# Patient Record
Sex: Female | Born: 1995 | Race: Black or African American | Hispanic: No | Marital: Single | Smoking: Current every day smoker
Health system: Southern US, Community
[De-identification: ages and names within clinical notes are randomized; demographics above are authoritative.]

---

## 2020-01-21 ENCOUNTER — Ambulatory Visit
Admission: EM | Admit: 2020-01-21 | Discharge: 2020-01-21 | Disposition: A | Discharge status: Home / Self Care | Payer: No Typology Code available for payment source

## 2020-01-21 ENCOUNTER — Other Ambulatory Visit: Payer: Self-pay

## 2020-01-21 ENCOUNTER — Ambulatory Visit (INDEPENDENT_AMBULATORY_CARE_PROVIDER_SITE_OTHER): Payer: No Typology Code available for payment source

## 2020-01-21 DIAGNOSIS — S93602A Unspecified sprain of left foot, initial encounter: Secondary | ICD-10-CM | POA: Diagnosis not present

## 2020-01-21 DIAGNOSIS — S93402A Sprain of unspecified ligament of left ankle, initial encounter: Secondary | ICD-10-CM

## 2020-01-21 DIAGNOSIS — M79672 Pain in left foot: Secondary | ICD-10-CM | POA: Diagnosis not present

## 2020-01-21 MED ORDER — IBUPROFEN 800 MG PO TABS
800.0000 mg | ORAL_TABLET | Freq: Three times a day (TID) | ORAL | 0 refills | Status: DC
Start: 2020-01-21 — End: 2021-05-18

## 2020-01-21 NOTE — ED Provider Notes (Signed)
[facility]    CSN: [id number] Arrival date & time: 01/21/20  1146      History   Chief Complaint Chief Complaint  Patient presents with  . Ankle Pain    HPI Cheryl Townsend is a 24 y.o. female.   History of Present Illness  Cheryl Townsend is a 24 y.o. female that complains of pain in the anterior aspect of the left foot and ankle without radiation after a twisting injury and fall. Onset of symptoms was 1 day ago. Patient describes pain as aching and throbbing. Pain severity now is moderate. Pain is aggravated by movement, weight bearing and palpation. Pain is alleviated by rest, acetaminophen, elevation and ACE wrap. Patient denies any numbness, tingling, weakness, loss of sensation or loss of motion in the extremity. The patient reports that she is unable to bear weight to the LLE due to the pain. The patient denies other injuries.  Care prior to arrival consisted of rest, acetaminophen, elevation, ice and ACE wrap with minimal relief.         History reviewed. No pertinent past medical history.  There are no problems to display for this patient.   History reviewed. No pertinent surgical history.  OB History   No obstetric history on file.      Home Medications    Prior to Admission medications   Medication Sig Start Date End Date Taking? Authorizing Provider  acetaminophen (TYLENOL) 500 MG tablet Take 500 mg by mouth every 6 (six) hours as needed.   Yes [provider]  ibuprofen (ADVIL) 800 MG tablet Take 1 tablet (800 mg total) by mouth 3 (three) times daily. 01/21/20   Lurline Idol, FNP    Family History History reviewed. No pertinent family history.  Social History Social History   Tobacco Use  . Smoking status: Current Every Day Smoker    Types: Cigarettes  . Smokeless tobacco: Never Used  Substance Use Topics  . Alcohol use: Yes  . Drug use: Never     Allergies   Patient has no known allergies.   Review of  Systems Review of Systems  Musculoskeletal: Positive for arthralgias.  All other systems reviewed and are negative.    Physical Exam Triage Vital Signs ED Triage Vitals  Enc Vitals Group     BP 01/21/20 1207 (!) 126/86     Pulse Rate 01/21/20 1207 94     Resp 01/21/20 1207 16     Temp 01/21/20 1207 98.3 F (36.8 C)     Temp Source 01/21/20 1207 Oral     SpO2 01/21/20 1207 98 %     Weight --      Height --      Head Circumference --      Peak Flow --      Pain Score 01/21/20 1205 5     Pain Loc --      Pain Edu? --      Excl. in GC? --    No data found.  Updated Vital Signs BP (!) 126/86 (BP Location: Right Arm)   Pulse 94   Temp 98.3 F (36.8 C) (Oral)   Resp 16   LMP  (Within Weeks) Comment: 2 weeks  SpO2 98%   Visual Acuity Right Eye Distance:   Left Eye Distance:   Bilateral Distance:    Right Eye Near:   Left Eye Near:    Bilateral Near:     Physical Exam Vitals reviewed.  Constitutional:  Appearance: Normal appearance.  HENT:     Head: Normocephalic.  Cardiovascular:     Rate and Rhythm: Normal rate and regular rhythm.  Pulmonary:     Effort: Pulmonary effort is normal.  Musculoskeletal:        General: Normal range of motion.     Cervical back: Normal range of motion and neck supple.     Left ankle: No swelling or deformity. Tenderness present. Normal range of motion.     Left Achilles Tendon: Normal.     Left foot: Normal range of motion. Tenderness present. No swelling or deformity.  Skin:    General: Skin is warm and dry.  Neurological:     General: No focal deficit present.     Mental Status: She is alert and oriented to person, place, and time.  Psychiatric:        Mood and Affect: Mood normal.      UC Treatments / Results  Labs (all labs ordered are listed, but only abnormal results are displayed) Labs Reviewed - No data to display  EKG   Radiology DG Ankle Complete Left  Result Date: 01/21/2020 CLINICAL DATA:   Twisted ankle EXAM: LEFT ANKLE COMPLETE - 3+ VIEW COMPARISON:  None. FINDINGS: Lateral soft tissue swelling.  No fracture or dislocation. IMPRESSION: Lateral soft tissue swelling.  No fracture or dislocation. Electronically Signed   By: Paulina Fusi M.D.   On: 01/21/2020 13:00   DG Foot Complete Left  Result Date: 01/21/2020 CLINICAL DATA:  Twisted ankle.  Lateral pain. EXAM: LEFT FOOT - COMPLETE 3+ VIEW COMPARISON:  None. FINDINGS: Lateral soft tissue swelling.  No fracture or dislocation. IMPRESSION: Lateral soft tissue swelling.  No fracture or dislocation. Electronically Signed   By: Paulina Fusi M.D.   On: 01/21/2020 13:01    Procedures Procedures (including critical care time)  Medications Ordered in UC Medications - No data to display  Initial Impression / Assessment and Plan / UC Course  I have reviewed the triage vital signs and the nursing notes.  Pertinent labs & imaging results that were available during my care of the patient were reviewed by me and considered in my medical decision making (see chart for details).    24 year old female presenting with left foot and left ankle pain after a twisting injury and fall 1 day ago.  X-rays show lateral soft tissue swelling without any fracture or dislocation.  ACE wrap and ankle splint applied in clinic. Crutches as needed for mobility assistance. RICE. Motrin as needed for pain. Follow-up with ortho if no improvement in symptoms.   Today's evaluation has revealed no signs of a dangerous process. Discussed diagnosis with patient and/or guardian. Patient and/or guardian aware of their diagnosis, possible red flag symptoms to watch out for and need for close follow up. Patient and/or guardian understands verbal and written discharge instructions. Patient and/or guardian comfortable with plan and disposition.  Patient and/or guardian has a clear mental status at this time, good insight into illness (after discussion and teaching) and has clear  judgment to make decisions regarding their care  This care was provided during an unprecedented National Emergency due to the Novel Coronavirus (COVID-19) pandemic. COVID-19 infections and transmission risks place heavy strains on healthcare resources.  As this pandemic evolves, our facility, providers, and staff strive to respond fluidly, to remain operational, and to provide care relative to available resources and information. Outcomes are unpredictable and treatments are without well-defined guidelines. Further, the impact of COVID-19  on all aspects of urgent care, including the impact to patients seeking care for reasons other than COVID-19, is unavoidable during this national emergency. At this time of the global pandemic, management of patients has significantly changed, even for non-COVID positive patients given high local and regional COVID volumes at this time requiring high healthcare system and resource utilization. The standard of care for management of both COVID suspected and non-COVID suspected patients continues to change rapidly at the local, regional, national, and global levels. This patient was worked up and treated to the best available but ever changing evidence and resources available at this current time.   Documentation was completed with the aid of voice recognition software. Transcription may contain typographical errors.  Final Clinical Impressions(s) / UC Diagnoses   Final diagnoses:  Sprain of left foot, initial encounter     Discharge Instructions     . Take medications as prescribed.  . Rest, ice, compression, and elevation (RICE). Marland Kitchen Keeping your foot in a fixed position (immobilization) for a period of time. This is done if your ligament is overstretched or partially torn. Your health care provider will apply a bandage, splint, or walking boot to keep your foot from moving until it heals. . Using crutches as needed few weeks to avoid putting a lot of weight on your  foot while it is healing. . Follow-up with orthopedics if your pain doesn't improve in the next week or so    ED Prescriptions    Medication Sig Dispense Auth. Provider   ibuprofen (ADVIL) 800 MG tablet Take 1 tablet (800 mg total) by mouth 3 (three) times daily. 21 tablet Lurline Idol, FNP     PDMP not reviewed this encounter.   Lurline Idol, [place] 01/21/20 1309

## 2020-01-21 NOTE — Discharge Instructions (Signed)
Take medications as prescribed.  Rest, ice, compression, and elevation (RICE). Keeping your foot in a fixed position (immobilization) for a period of time. This is done if your ligament is overstretched or partially torn. Your health care provider will apply a bandage, splint, or walking boot to keep your foot from moving until it heals. Using crutches as needed few weeks to avoid putting a lot of weight on your foot while it is healing. Follow-up with orthopedics if your pain doesn't improve in the next week or so

## 2020-01-21 NOTE — ED Triage Notes (Signed)
Pt presents with swelling and pain in the left ankle x 1 day. States she twisted left ankle when walking wearing a platform shoes. Tylenol gives somewhat relief.

## 2021-05-18 ENCOUNTER — Emergency Department (HOSPITAL_COMMUNITY)
Admission: EM | Admit: 2021-05-18 | Discharge: 2021-05-19 | Disposition: A | Discharge status: Home / Self Care | Payer: No Typology Code available for payment source | Attending: Emergency Medicine | Admitting: Emergency Medicine

## 2021-05-18 ENCOUNTER — Other Ambulatory Visit: Payer: Self-pay

## 2021-05-18 ENCOUNTER — Encounter (HOSPITAL_COMMUNITY): Payer: Self-pay | Admitting: Emergency Medicine

## 2021-05-18 DIAGNOSIS — G43009 Migraine without aura, not intractable, without status migrainosus: Secondary | ICD-10-CM | POA: Insufficient documentation

## 2021-05-18 DIAGNOSIS — G43909 Migraine, unspecified, not intractable, without status migrainosus: Secondary | ICD-10-CM

## 2021-05-18 DIAGNOSIS — F17210 Nicotine dependence, cigarettes, uncomplicated: Secondary | ICD-10-CM | POA: Diagnosis not present

## 2021-05-18 MED ORDER — DIPHENHYDRAMINE HCL 50 MG/ML IJ SOLN
50.0000 mg | Freq: Once | INTRAMUSCULAR | Status: AC
  Administered 2021-05-19: 50 mg via INTRAVENOUS
  Filled 2021-05-18: qty 1

## 2021-05-18 MED ORDER — METOCLOPRAMIDE HCL 5 MG/ML IJ SOLN
10.0000 mg | Freq: Once | INTRAMUSCULAR | Status: AC
  Administered 2021-05-19: 10 mg via INTRAVENOUS
  Filled 2021-05-18: qty 2

## 2021-05-18 MED ORDER — KETOROLAC TROMETHAMINE 15 MG/ML IJ SOLN
15.0000 mg | Freq: Once | INTRAMUSCULAR | Status: AC
  Administered 2021-05-19: 15 mg via INTRAVENOUS
  Filled 2021-05-18: qty 1

## 2021-05-18 MED ORDER — SODIUM CHLORIDE 0.9 % IV BOLUS
1000.0000 mL | Freq: Once | INTRAVENOUS | Status: AC
  Administered 2021-05-18: 1000 mL via INTRAVENOUS

## 2021-05-18 NOTE — ED Provider Notes (Signed)
[facility] DEPT Provider Note   CSN: [id number] Arrival date & time: 05/18/21  2035     History Chief Complaint  Patient presents with   Migraine    Cheryl Townsend is a 25 y.o. female who presents to the ED today with complaint of migraine headache for the past 1 week.  She reports that this feels typical to previous migraine headaches however has been more persistent over the past couple days and lasting longer.  She states that she took Tylenol however vomited and so she was unable to take any other medications.  She also complains of some photophobia.  She denies any trauma to her head.  She denies any fevers, chills, neck stiffness, rash, unilateral weakness or numbness, any other associated symptoms.   The history is provided by the patient and medical records.      History reviewed. No pertinent past medical history.  There are no problems to display for this patient.   History reviewed. No pertinent surgical history.   OB History   No obstetric history on file.     History reviewed. No pertinent family history.  Social History   Tobacco Use   Smoking status: Every Day    Types: Cigarettes   Smokeless tobacco: Never  Substance Use Topics   Alcohol use: Yes   Drug use: Never    Home Medications Prior to Admission medications   Not on File    Allergies    Patient has no known allergies.  Review of Systems   Review of Systems  Constitutional:  Negative for chills and fever.  Eyes:  Positive for photophobia. Negative for visual disturbance.  Gastrointestinal:  Positive for nausea and vomiting. Negative for abdominal pain.  Neurological:  Positive for headaches. Negative for speech difficulty, weakness and numbness.  All other systems reviewed and are negative.  Physical Exam Updated Vital Signs BP 119/71 (BP Location: Left Arm)   Pulse 88   Temp 98.4 F (36.9 C)   Resp 16   Ht 5\' 3"  (1.6 m)   Wt 81.6 kg   SpO2  97%   BMI 31.89 kg/m   Physical Exam Vitals and nursing note reviewed.  Constitutional:      Appearance: She is not ill-appearing.  HENT:     Head: Normocephalic and atraumatic.  Eyes:     Extraocular Movements: Extraocular movements intact.     Conjunctiva/sclera: Conjunctivae normal.     Pupils: Pupils are equal, round, and reactive to light.  Cardiovascular:     Rate and Rhythm: Normal rate and regular rhythm.     Pulses: Normal pulses.  Pulmonary:     Effort: Pulmonary effort is normal.     Breath sounds: Normal breath sounds. No wheezing, rhonchi or rales.  Abdominal:     Palpations: Abdomen is soft.     Tenderness: There is no abdominal tenderness.  Musculoskeletal:     Cervical back: Neck supple.  Skin:    General: Skin is warm and dry.  Neurological:     Mental Status: She is alert.     Comments: Alert and oriented to self, place, time and event.   Speech is fluent, clear without dysarthria or dysphasia.   Strength 5/5 in upper/lower extremities   Sensation intact in upper/lower extremities   Normal gait.  Negative Romberg. No pronator drift.  Normal finger-to-nose and feet tapping.  CN I not tested  CN II grossly intact visual fields bilaterally. Did not visualize posterior eye.  CN III, IV, VI PERRLA and EOMs intact bilaterally  CN V Intact sensation to sharp and light touch to the face  CN VII facial movements symmetric  CN VIII not tested  CN IX, X no uvula deviation, symmetric rise of soft palate  CN XI 5/5 SCM and trapezius strength bilaterally  CN XII Midline tongue protrusion, symmetric L/R movements      ED Results / Procedures / Treatments   Labs (all labs ordered are listed, but only abnormal results are displayed) Labs Reviewed  PREGNANCY, URINE    EKG None  Radiology No results found.  Procedures Procedures   Medications Ordered in ED Medications  sodium chloride 0.9 % bolus 1,000 mL (1,000 mLs Intravenous New Bag/Given  05/18/21 2356)  metoCLOPramide (REGLAN) injection 10 mg (10 mg Intravenous Given 05/19/21 0050)  diphenhydrAMINE (BENADRYL) injection 50 mg (50 mg Intravenous Given 05/19/21 0050)  ketorolac (TORADOL) 15 MG/ML injection 15 mg (15 mg Intravenous Given 05/19/21 0050)    ED Course  I have reviewed the triage vital signs and the nursing notes.  Pertinent labs & imaging results that were available during my care of the patient were reviewed by me and considered in my medical decision making (see chart for details).    MDM Rules/Calculators/A&P                           25 year old female presents to the ED today with complaint of migraine headache for the past week without improvement after Tylenol.  On arrival to the ED today patient is afebrile, nontachycardic and nontachypneic.  Blood pressure is slightly elevated 162/84 without history of same.  She is noted to be resting in the room with lights turned off she is experiencing photophobia.  She has no focal neurodeficits on exam today and no meningeal signs.  She denies any concerning symptoms today and states that her headache feels typical to previous migraine headache.  We will plan for headache cocktail at this time and reevaluation.  Question of blood pressure could be slightly elevated secondary to pain versus pain could be related to elevated blood pressure.  We will continue to monitor.  We will plan for urine pregnancy test prior to headache cocktail.   UPT negative Pt reevaluated after headache cocktail. Reports improvement in pain; will plan to fluid challenge and dispo home accordingly afterwards.   Pt able to tolerate fluids without difficulty. Stable for discharge home at this time.   This note was prepared using Dragon voice recognition software and may include unintentional dictation errors due to the inherent limitations of voice recognition software.  Final Clinical Impression(s) / ED Diagnoses Final diagnoses:  Migraine  without status migrainosus, not intractable, unspecified migraine type    Rx / DC Orders ED Discharge Orders     None        Discharge Instructions      Please follow up with your PCP regarding ED visit today/recheck of your headache  Return to the ED for any new/worsening symptoms       Tanda Rockers, PA-C 05/19/21 0253    Dione Booze, MD 05/19/21 [phone]

## 2021-05-18 NOTE — ED Triage Notes (Signed)
Reports migraine x1 week with nausea and sensitivity to light. States she has not had relief with tylenol. Denies significant medical history aside from chronic headaches.

## 2021-05-18 NOTE — ED Provider Notes (Signed)
Emergency Medicine Provider Triage Evaluation Note  Cheryl Townsend , a 25 y.o. female  was evaluated in triage.  Pt complains of migraine.  Has history of similar.  Has had some nausea and sensitivity to light.  She took Tylenol once which made her vomit so she subsequently has not taken any additional medications.  No recent head trauma.  Does have some photophobia.  No neck stiffness neck rigidity.  No fever.  No paresthesias or weakness.  Review of Systems  Positive: Headache Negative: Numbness, weakness  Physical Exam  BP (!) 162/84 (BP Location: Left Arm)   Pulse 86   Temp 98 F (36.7 C) (Oral)   Resp 16   Ht 5\' 3"  (1.6 m)   Wt 81.6 kg   SpO2 99%   BMI 31.89 kg/m  Gen:   Awake, no distress   Resp:  Normal effort  MSK:   Moves extremities without difficulty  Neuro:  Moves all 4 extremities, CN 2-12 grossly intact Other:    Medical Decision Making  Medically screening exam initiated at 9:04 PM.  Appropriate orders placed.  Cheryl Townsend was informed that the remainder of the evaluation will be completed by another provider, this initial triage assessment does not replace that evaluation, and the importance of remaining in the ED until their evaluation is complete.  Headache   Audi Conover A, PA-C 05/18/21 2105    2106, MD 05/19/21 1049

## 2021-05-19 LAB — PREGNANCY, URINE: Preg Test, Ur: NEGATIVE

## 2021-05-19 NOTE — Discharge Instructions (Signed)
Please follow up with your PCP regarding ED visit today/recheck of your headache  Return to the ED for any new/worsening symptoms

## 2021-05-19 NOTE — ED Notes (Signed)
Pt tolerated OJ with no n/v

## 2021-08-21 ENCOUNTER — Ambulatory Visit: Payer: No Typology Code available for payment source | Admitting: Emergency Medicine

## 2021-09-29 IMAGING — DX DG FOOT COMPLETE 3+V*L*
3 series · 3 of 3 positions shown · non-contrast
Comparison: None.

CLINICAL DATA: Twisted ankle.  Lateral pain.

EXAM:
LEFT FOOT - COMPLETE 3+ VIEW

[foot supine dp]
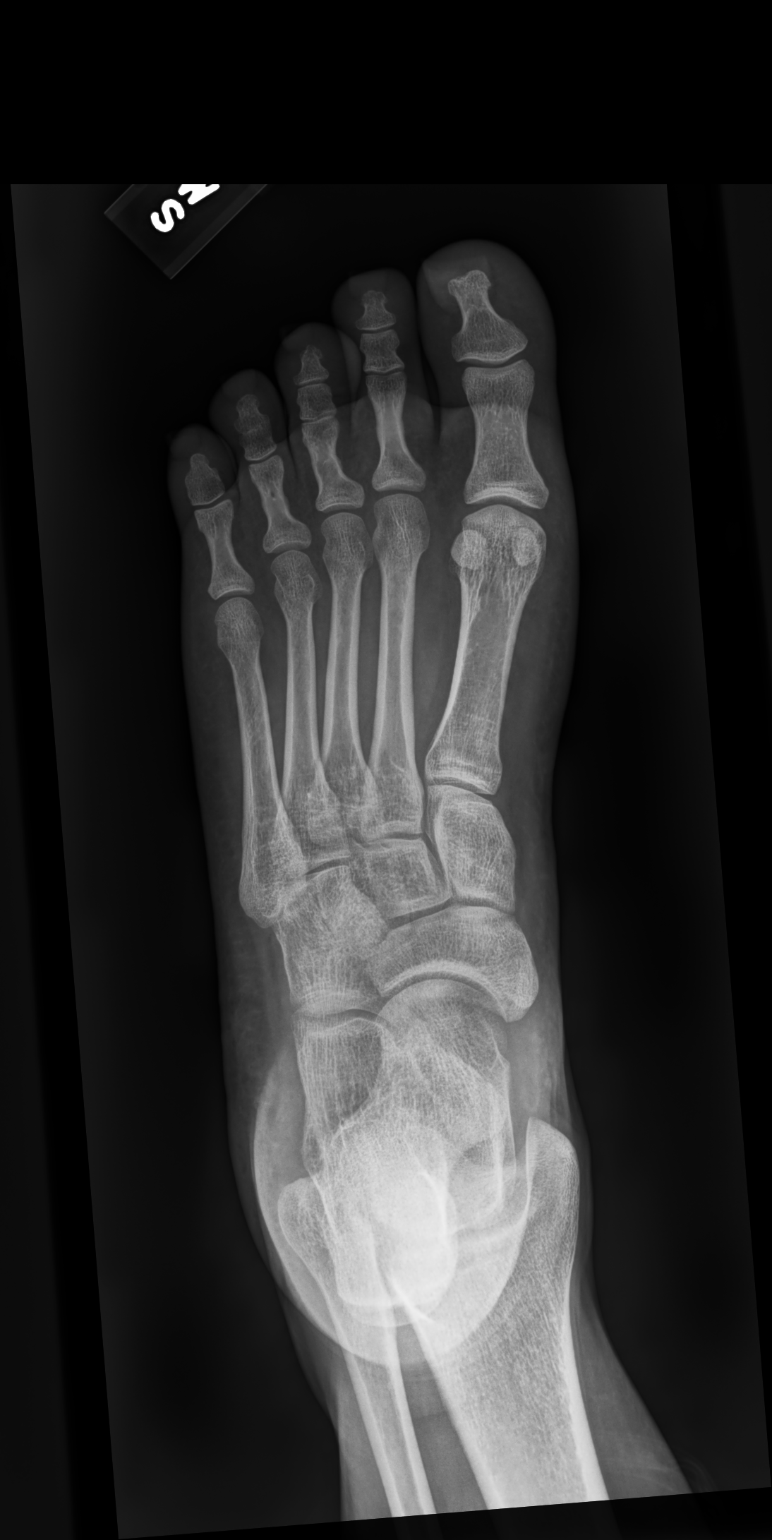

[foot medial oblique]
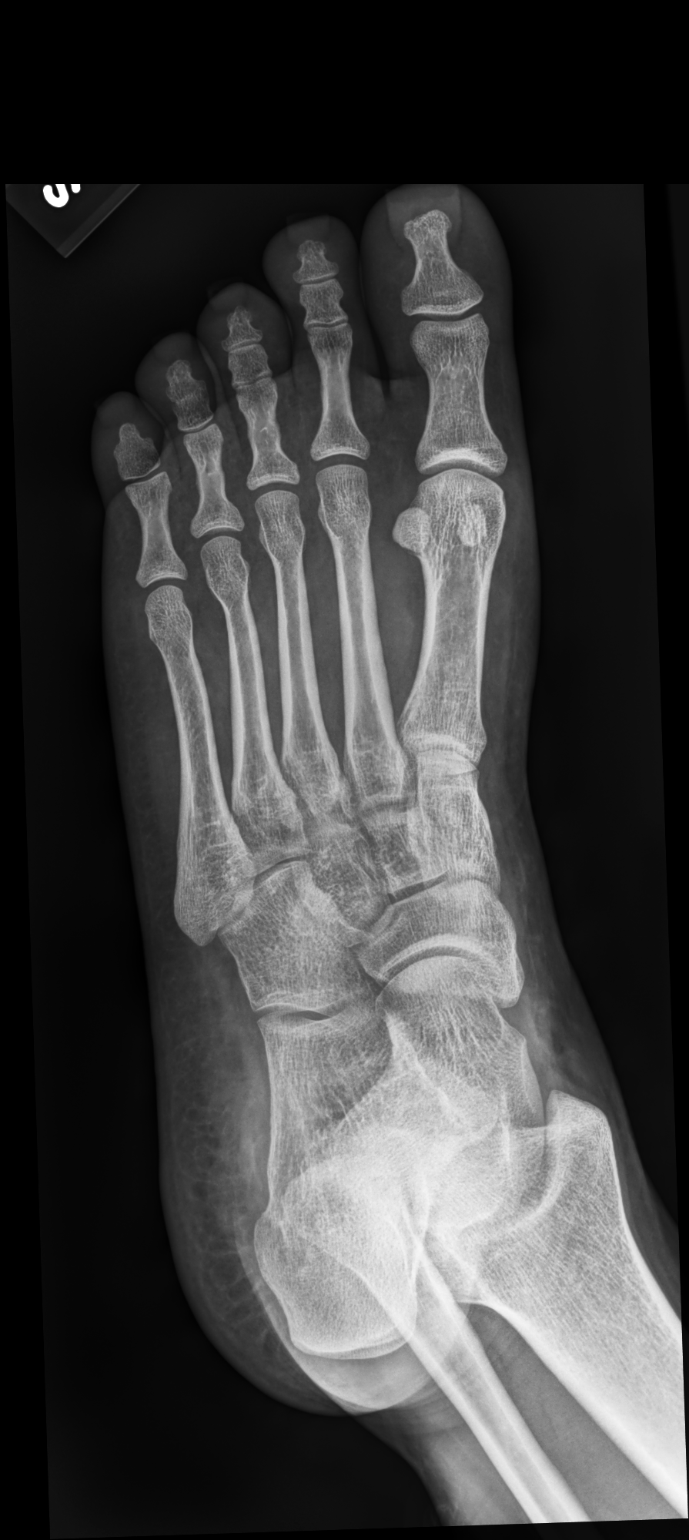

[foot supine lat]
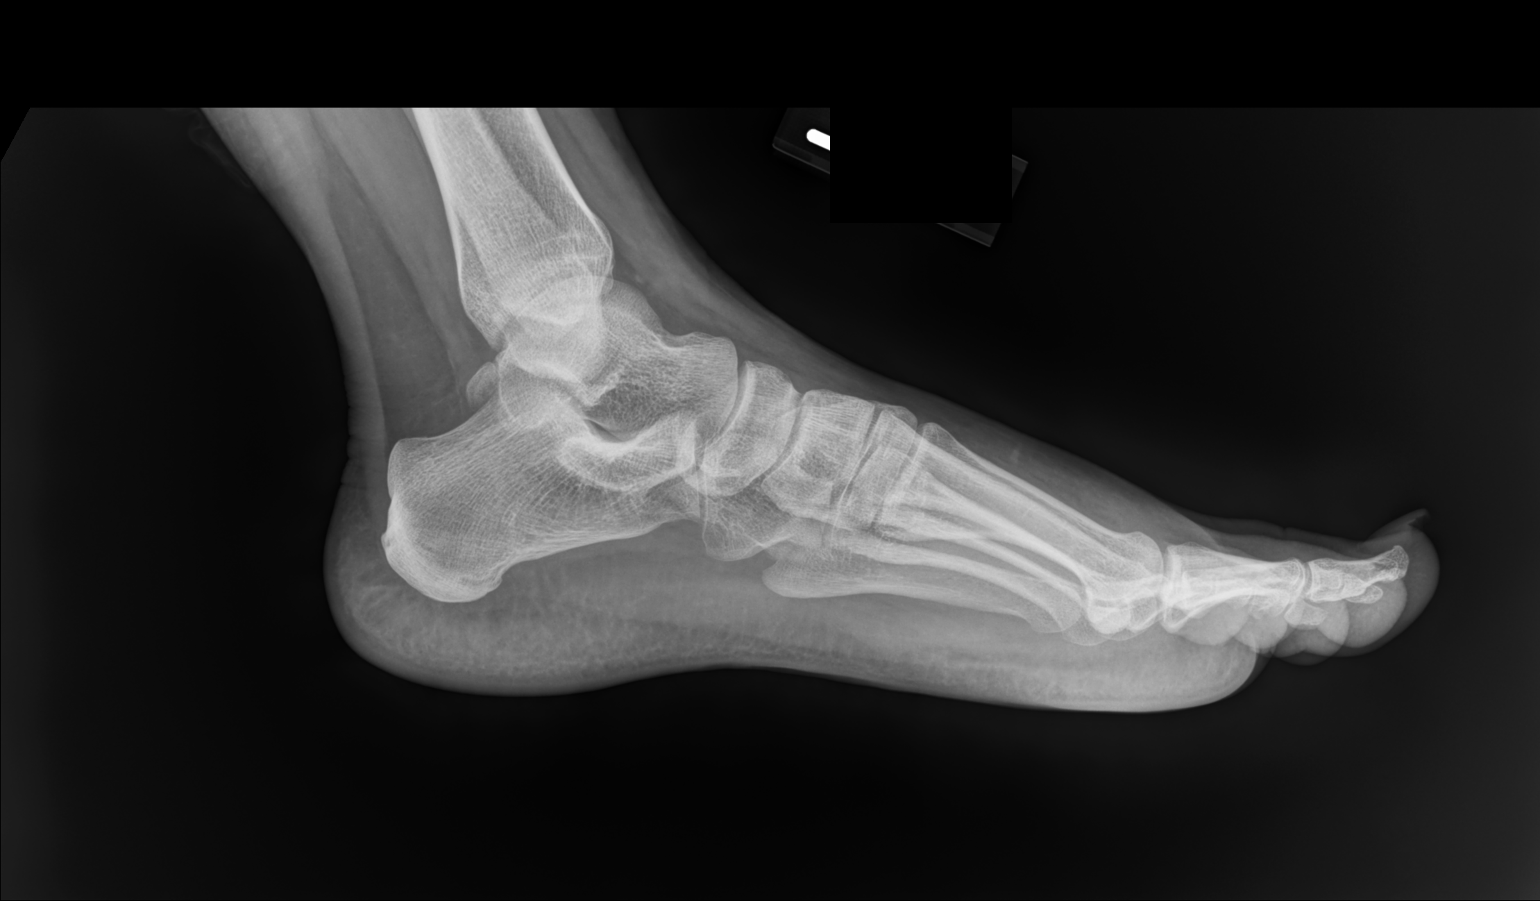

[3 of 3 positions shown; findings below may reference images not displayed]

FINDINGS: Lateral soft tissue swelling.  No fracture or dislocation.
IMPRESSION: Lateral soft tissue swelling.  No fracture or dislocation.

## 2022-03-07 ENCOUNTER — Encounter: Payer: Self-pay | Admitting: Emergency Medicine

## 2022-03-07 ENCOUNTER — Ambulatory Visit
Admission: EM | Admit: 2022-03-07 | Discharge: 2022-03-07 | Disposition: A | Discharge status: Home / Self Care | Payer: No Typology Code available for payment source | Attending: Internal Medicine | Admitting: Internal Medicine

## 2022-03-07 DIAGNOSIS — J020 Streptococcal pharyngitis: Secondary | ICD-10-CM

## 2022-03-07 LAB — POCT RAPID STREP A (OFFICE): Rapid Strep A Screen: POSITIVE — AB

## 2022-03-07 MED ORDER — AMOXICILLIN 500 MG PO CAPS: 500.0000 mg | ORAL_CAPSULE | Freq: Two times a day (BID) | ORAL | 0 refills | Status: AC

## 2022-03-07 NOTE — Discharge Instructions (Signed)
You have strep throat which is being treated with an antibiotic.  Please follow-up if any symptoms persist or worsen. 

## 2022-03-07 NOTE — ED Provider Notes (Signed)
[facility]    CSN: [id number] Arrival date & time: 03/07/22  1044      History   Chief Complaint Chief Complaint  Patient presents with   Sore Throat    HPI Cheryl Townsend is a 26 y.o. female.   Patient presents with sore throat that has been present for about 6 days.  Patient reports a very mild nonproductive cough as well but denies nasal congestion, runny nose, ear pain, fever.  Boyfriend has had similar symptoms.  Denies chest pain, shortness of breath, nausea, vomiting, diarrhea, abdominal pain.  Patient has used over-the-counter medications including throat spray for symptoms.   Sore Throat    History reviewed. No pertinent past medical history.  There are no problems to display for this patient.   History reviewed. No pertinent surgical history.  OB History   No obstetric history on file.      Home Medications    Prior to Admission medications   Medication Sig Start Date End Date Taking? Authorizing Provider  amoxicillin (AMOXIL) 500 MG capsule Take 1 capsule (500 mg total) by mouth 2 (two) times daily for 10 days. 03/07/22 03/17/22 Yes Gustavus Bryant, FNP    Family History History reviewed. No pertinent family history.  Social History Social History   Tobacco Use   Smoking status: Every Day    Types: Cigarettes   Smokeless tobacco: Never  Substance Use Topics   Alcohol use: Yes   Drug use: Never     Allergies   Patient has no known allergies.   Review of Systems Review of Systems Per HPI  Physical Exam Triage Vital Signs ED Triage Vitals  Enc Vitals Group     BP 03/07/22 1059 108/62     Pulse Rate 03/07/22 1059 97     Resp 03/07/22 1059 18     Temp 03/07/22 1059 98.1 F (36.7 C)     Temp src --      SpO2 03/07/22 1059 100 %     Weight --      Height --      Head Circumference --      Peak Flow --      Pain Score 03/07/22 1058 7     Pain Loc --      Pain Edu? --      Excl. in GC? --    No data  found.  Updated Vital Signs BP 108/62   Pulse 97   Temp 98.1 F (36.7 C)   Resp 18   SpO2 100%   Visual Acuity Right Eye Distance:   Left Eye Distance:   Bilateral Distance:    Right Eye Near:   Left Eye Near:    Bilateral Near:     Physical Exam Constitutional:      General: She is not in acute distress.    Appearance: Normal appearance. She is not toxic-appearing or diaphoretic.  HENT:     Head: Normocephalic and atraumatic.     Right Ear: Tympanic membrane and ear canal normal.     Left Ear: Tympanic membrane and ear canal normal.     Nose: No congestion.     Mouth/Throat:     Mouth: Mucous membranes are moist.     Pharynx: Posterior oropharyngeal erythema present. No pharyngeal swelling or oropharyngeal exudate.     Tonsils: No tonsillar exudate or tonsillar abscesses. 1+ on the right. 1+ on the left.  Eyes:     Extraocular Movements: Extraocular movements intact.  Conjunctiva/sclera: Conjunctivae normal.     Pupils: Pupils are equal, round, and reactive to light.  Cardiovascular:     Rate and Rhythm: Normal rate and regular rhythm.     Pulses: Normal pulses.     Heart sounds: Normal heart sounds.  Pulmonary:     Effort: Pulmonary effort is normal. No respiratory distress.     Breath sounds: Normal breath sounds. No wheezing.  Abdominal:     General: Abdomen is flat. Bowel sounds are normal.     Palpations: Abdomen is soft.  Musculoskeletal:        General: Normal range of motion.     Cervical back: Normal range of motion.  Skin:    General: Skin is warm and dry.  Neurological:     General: No focal deficit present.     Mental Status: She is alert and oriented to person, place, and time. Mental status is at baseline.  Psychiatric:        Mood and Affect: Mood normal.        Behavior: Behavior normal.        Thought Content: Thought content normal.        Judgment: Judgment normal.      UC Treatments / Results  Labs (all labs ordered are listed,  but only abnormal results are displayed) Labs Reviewed  POCT RAPID STREP A (OFFICE) - Abnormal; Notable for the following components:      Result Value   Rapid Strep A Screen Positive (*)    All other components within normal limits    EKG   Radiology No results found.  Procedures Procedures (including critical care time)  Medications Ordered in UC Medications - No data to display  Initial Impression / Assessment and Plan / UC Course  I have reviewed the triage vital signs and the nursing notes.  Pertinent labs & imaging results that were available during my care of the patient were reviewed by me and considered in my medical decision making (see chart for details).     Rapid strep was positive.  No signs of peritonsillar abscess on exam.  Will treat with amoxicillin antibiotic.  Patient to follow-up if symptoms persist or worsen.  Patient verbalized understanding and was agreeable with plan. Final Clinical Impressions(s) / UC Diagnoses   Final diagnoses:  Strep pharyngitis     Discharge Instructions      You have strep throat which is being treated with an antibiotic.  Please follow-up if any symptoms persist or worsen.    ED Prescriptions     Medication Sig Dispense Auth. Provider   amoxicillin (AMOXIL) 500 MG capsule Take 1 capsule (500 mg total) by mouth 2 (two) times daily for 10 days. 20 capsule Gustavus Bryant, [place]      PDMP not reviewed this encounter.   Gustavus Bryant, [place] 03/07/22 1115

## 2022-03-07 NOTE — ED Triage Notes (Signed)
Pt is present today with c/o sore throat x6 days ago

## 2022-10-30 ENCOUNTER — Institutional Professional Consult (permissible substitution): Payer: Self-pay | Admitting: Licensed Clinical Social Worker

## 2022-10-30 ENCOUNTER — Encounter: Payer: Self-pay | Admitting: Obstetrics

## 2023-02-15 ENCOUNTER — Encounter (HOSPITAL_COMMUNITY): Payer: Self-pay

## 2023-02-15 ENCOUNTER — Other Ambulatory Visit: Payer: Self-pay

## 2023-02-15 ENCOUNTER — Emergency Department (HOSPITAL_COMMUNITY)
Admission: EM | Admit: 2023-02-15 | Discharge: 2023-02-15 | Disposition: A | Discharge status: Home / Self Care | Payer: Self-pay | Attending: Emergency Medicine | Admitting: Emergency Medicine

## 2023-02-15 ENCOUNTER — Emergency Department (HOSPITAL_COMMUNITY): Payer: Self-pay

## 2023-02-15 DIAGNOSIS — K8080 Other cholelithiasis without obstruction: Secondary | ICD-10-CM | POA: Insufficient documentation

## 2023-02-15 DIAGNOSIS — R1013 Epigastric pain: Secondary | ICD-10-CM

## 2023-02-15 DIAGNOSIS — R7401 Elevation of levels of liver transaminase levels: Secondary | ICD-10-CM | POA: Insufficient documentation

## 2023-02-15 LAB — URINALYSIS, ROUTINE W REFLEX MICROSCOPIC
Bilirubin Urine: NEGATIVE
Glucose, UA: NEGATIVE mg/dL
Hgb urine dipstick: NEGATIVE
Ketones, ur: NEGATIVE mg/dL
Leukocytes,Ua: NEGATIVE
Nitrite: NEGATIVE
Protein, ur: NEGATIVE mg/dL
Specific Gravity, Urine: 1.025 (ref 1.005–1.030)
pH: 6 (ref 5.0–8.0)

## 2023-02-15 LAB — CBC WITH DIFFERENTIAL/PLATELET
Abs Immature Granulocytes: 0.03 10*3/uL (ref 0.00–0.07)
Basophils Absolute: 0 10*3/uL (ref 0.0–0.1)
Basophils Relative: 0 %
Eosinophils Absolute: 0.2 10*3/uL (ref 0.0–0.5)
Eosinophils Relative: 2 %
HCT: 39.9 % (ref 36.0–46.0)
Hemoglobin: 12.6 g/dL (ref 12.0–15.0)
Immature Granulocytes: 0 %
Lymphocytes Relative: 28 %
Lymphs Abs: 2.4 10*3/uL (ref 0.7–4.0)
MCH: 27.6 pg (ref 26.0–34.0)
MCHC: 31.6 g/dL (ref 30.0–36.0)
MCV: 87.3 fL (ref 80.0–100.0)
Monocytes Absolute: 0.3 10*3/uL (ref 0.1–1.0)
Monocytes Relative: 4 %
Neutro Abs: 5.6 10*3/uL (ref 1.7–7.7)
Neutrophils Relative %: 66 %
Platelets: 281 10*3/uL (ref 150–400)
RBC: 4.57 MIL/uL (ref 3.87–5.11)
RDW: 12.2 % (ref 11.5–15.5)
WBC: 8.6 10*3/uL (ref 4.0–10.5)
nRBC: 0 % (ref 0.0–0.2)

## 2023-02-15 LAB — COMPREHENSIVE METABOLIC PANEL
ALT: 40 U/L (ref 0–44)
AST: 85 U/L — ABNORMAL HIGH (ref 15–41)
Albumin: 3.7 g/dL (ref 3.5–5.0)
Alkaline Phosphatase: 52 U/L (ref 38–126)
Anion gap: 7 (ref 5–15)
BUN: 17 mg/dL (ref 6–20)
CO2: 27 mmol/L (ref 22–32)
Calcium: 8.4 mg/dL — ABNORMAL LOW (ref 8.9–10.3)
Chloride: 104 mmol/L (ref 98–111)
Creatinine, Ser: 0.76 mg/dL (ref 0.44–1.00)
GFR, Estimated: >60 mL/min (ref >60)
Glucose, Bld: 120 mg/dL — ABNORMAL HIGH (ref 70–99)
Potassium: 3.6 mmol/L (ref 3.5–5.1)
Sodium: 138 mmol/L (ref 135–145)
Total Bilirubin: 0.4 mg/dL (ref 0.3–1.2)
Total Protein: 7.2 g/dL (ref 6.5–8.1)

## 2023-02-15 LAB — LIPASE, BLOOD: Lipase: 30 U/L (ref 11–51)

## 2023-02-15 LAB — TROPONIN I (HIGH SENSITIVITY): Troponin I (High Sensitivity): <2 ng/L (ref <18)

## 2023-02-15 LAB — HCG, SERUM, QUALITATIVE: Preg, Serum: NEGATIVE

## 2023-02-15 MED ORDER — FAMOTIDINE IN NACL 20-0.9 MG/50ML-% IV SOLN
20.0000 mg | Freq: Once | INTRAVENOUS | Status: AC
  Administered 2023-02-15: 20 mg via INTRAVENOUS
  Filled 2023-02-15: qty 50

## 2023-02-15 MED ORDER — OMEPRAZOLE 20 MG PO CPDR: 20.0000 mg | DELAYED_RELEASE_CAPSULE | Freq: Every day | ORAL | 0 refills | Status: AC

## 2023-02-15 MED ORDER — ONDANSETRON HCL 4 MG/2ML IJ SOLN
4.0000 mg | Freq: Once | INTRAMUSCULAR | Status: AC
  Administered 2023-02-15: 4 mg via INTRAVENOUS
  Filled 2023-02-15: qty 2

## 2023-02-15 MED ORDER — FENTANYL CITRATE PF 50 MCG/ML IJ SOSY
50.0000 ug | PREFILLED_SYRINGE | Freq: Once | INTRAMUSCULAR | Status: AC
  Administered 2023-02-15: 50 ug via INTRAVENOUS
  Filled 2023-02-15: qty 1

## 2023-02-15 MED ORDER — IOHEXOL 300 MG/ML  SOLN
100.0000 mL | Freq: Once | INTRAMUSCULAR | Status: AC | PRN
  Administered 2023-02-15: 100 mL via INTRAVENOUS

## 2023-02-15 MED ORDER — SODIUM CHLORIDE (PF) 0.9 % IJ SOLN
INTRAMUSCULAR | Status: AC
  Filled 2023-02-15: qty 50

## 2023-02-15 MED ORDER — ONDANSETRON 4 MG PO TBDP: 4.0000 mg | ORAL_TABLET | Freq: Three times a day (TID) | ORAL | 0 refills | Status: AC | PRN

## 2023-02-15 MED ORDER — LACTATED RINGERS IV BOLUS
1000.0000 mL | Freq: Once | INTRAVENOUS | Status: AC
  Administered 2023-02-15: 1000 mL via INTRAVENOUS

## 2023-02-15 NOTE — ED Provider Notes (Signed)
Received patient in turnover from Dr. Lorenda Hatchet.  Please see their note for further details of Hx, PE.  Briefly patient is a 27 y.o. female with a Abdominal Pain .  Patient was found to have cholelithiasis without acute cholecystitis.  Plan to await for CT imaging.  CT is resulted with the same, no obvious signs of acute cholecystitis no other obvious cause of her abdominal discomfort.  Patient is feeling better on repeat exam.  Comfortable with going home.  Will have her follow-up with general surgery.    Melene Plan, DO 02/15/23 [phone]

## 2023-02-15 NOTE — ED Provider Notes (Signed)
[facility] AT [facility] Provider Note   CSN: [id number] Arrival date & time: 02/15/23  0446     History  Chief Complaint  Patient presents with   Abdominal Pain    Cheryl Townsend is a 27 y.o. female.  Patient presents via EMS with epigastric abdominal pain with nausea and vomiting that woke her from sleep about 2:15 AM.  States she felt well going to bed last night and had a normal day throughout the day yesterday eating and drinking normally.  Woke up with severe epigastric and right upper quadrant abdominal pain that radiates into her chest.  Associated with 2 episodes of vomiting.  Pain progressively worsening for the past 3 hours.  Nothing makes it better or worse.  Denies having this pain in the past.  Denies any history of reflux or ulcers.  Denies any stomach issues.  Denies any cardiac issues.  Still has appendix and gallbladder.  No recent black or bloody stools.  The history is provided by the patient.  Abdominal Pain Associated symptoms: nausea and vomiting   Associated symptoms: no chest pain, no cough, no dysuria, no fever, no hematuria and no shortness of breath        Home Medications Prior to Admission medications   Not on File      Allergies    Patient has no known allergies.    Review of Systems   Review of Systems  Constitutional:  Positive for activity change and appetite change. Negative for fever.  HENT:  Negative for congestion and rhinorrhea.   Respiratory:  Negative for cough, chest tightness and shortness of breath.   Cardiovascular:  Negative for chest pain.  Gastrointestinal:  Positive for abdominal pain, nausea and vomiting.  Genitourinary:  Negative for dysuria and hematuria.  Musculoskeletal:  Negative for arthralgias and myalgias.  Skin:  Negative for rash.  Neurological:  Negative for weakness and headaches.   all other systems are negative except as noted in the HPI and PMH.    Physical  Exam Updated Vital Signs BP 124/78   Pulse 64   Temp 98 F (36.7 C)   Resp 16   Ht 5\' 3"  (1.6 m)   Wt 81.6 kg   SpO2 100%   BMI 31.87 kg/m  Physical Exam Vitals and nursing note reviewed.  Constitutional:      General: She is not in acute distress.    Appearance: She is well-developed.  HENT:     Head: Normocephalic and atraumatic.     Mouth/Throat:     Mouth: Oropharynx is clear and moist.     Pharynx: No oropharyngeal exudate.  Eyes:     Extraocular Movements: EOM normal.     Conjunctiva/sclera: Conjunctivae normal.     Pupils: Pupils are equal, round, and reactive to light.  Neck:     Comments: No meningismus. Cardiovascular:     Rate and Rhythm: Normal rate and regular rhythm.     Heart sounds: Normal heart sounds. No murmur heard. Pulmonary:     Effort: Pulmonary effort is normal. No respiratory distress.     Breath sounds: Normal breath sounds.  Abdominal:     Palpations: Abdomen is soft.     Tenderness: There is abdominal tenderness. There is guarding. There is no rebound.     Comments: Right upper quadrant epigastric tenderness with voluntary guarding.  No lower abdominal tenderness  Musculoskeletal:        General: No tenderness or edema. Normal  range of motion.     Cervical back: Normal range of motion and neck supple.  Skin:    General: Skin is warm.  Neurological:     Mental Status: She is alert and oriented to person, place, and time.     Cranial Nerves: No cranial nerve deficit.     Motor: No abnormal muscle tone.     Coordination: Coordination normal.     Comments: No ataxia on finger to nose bilaterally. No pronator drift. 5/5 strength throughout. CN 2-12 intact.Equal grip strength. Sensation intact.   Psychiatric:        Mood and Affect: Mood and affect normal.        Behavior: Behavior normal.     ED Results / Procedures / Treatments   Labs (all labs ordered are listed, but only abnormal results are displayed) Labs Reviewed  COMPREHENSIVE  METABOLIC PANEL - Abnormal; Notable for the following components:      Result Value   Glucose, Bld 120 (*)    Calcium 8.4 (*)    AST 85 (*)    All other components within normal limits  CBC WITH DIFFERENTIAL/PLATELET  LIPASE, BLOOD  HCG, SERUM, QUALITATIVE  URINALYSIS, ROUTINE W REFLEX MICROSCOPIC  TROPONIN I (HIGH SENSITIVITY)  TROPONIN I (HIGH SENSITIVITY)    EKG EKG Interpretation Date/Time:  Saturday February 15 2023 05:36:56 EDT Ventricular Rate:  68 PR Interval:  141 QRS Duration:  76 QT Interval:  395 QTC Calculation: 421 R Axis:   44  Text Interpretation: Sinus rhythm No previous ECGs available Confirmed by Glynn Octave [phone]) on 02/15/2023 5:44:19 AM  Radiology US Abdomen Limited RUQ (LIVER/GB)  Result Date: 02/15/2023 CLINICAL DATA:  27 year old female with history of right upper quadrant abdominal pain. EXAM: ULTRASOUND ABDOMEN LIMITED RIGHT UPPER QUADRANT COMPARISON:  No priors. FINDINGS: Gallbladder: There are echogenic foci with posterior acoustic shadowing lying dependently within the gallbladder, largest of which measures up to 1.2 cm. Gallbladder is only mildly distended. Gallbladder wall thickness is normal at 1.7 mm. No pericholecystic fluid. Per report from the sonographer, there was no sonographic Murphy's sign on examination. Common bile duct: Diameter: 3.0 mm Liver: No focal lesion identified. Within normal limits in parenchymal echogenicity. Portal vein is patent on color Doppler imaging with normal direction of blood flow towards the liver. Other: None. IMPRESSION: 1. Study is positive for cholelithiasis, however, there are no imaging findings to suggest an acute cholecystitis at this time. Electronically Signed   By: Trudie Reed M.D.   On: 02/15/2023 06:09    Procedures Procedures    Medications Ordered in ED Medications  lactated ringers bolus 1,000 mL (has no administration in time range)  fentaNYL (SUBLIMAZE) injection 50 mcg (has no  administration in time range)  ondansetron (ZOFRAN) injection 4 mg (has no administration in time range)  famotidine (PEPCID) IVPB 20 mg premix (has no administration in time range)    ED Course/ Medical Decision Making/ A&P                                 Medical Decision Making Amount and/or Complexity of Data Reviewed Independent Historian: EMS Labs: ordered. Decision-making details documented in ED Course. Radiology: ordered and independent interpretation performed. Decision-making details documented in ED Course. ECG/medicine tests: ordered and independent interpretation performed. Decision-making details documented in ED Course.  Risk Prescription drug management.  Upper abdominal pain with nausea and vomiting.  Vital stable, no  distress.  Abdomen soft but tender in the right upper quadrant epigastrium.  Patient given IV fluids, pain and nausea medication.  No leukocytosis.  Mild transaminitis.  Lipase is normal.  Pain has improved.  Ultrasound shows evidence of gallstones without evidence of cholecystitis at this time.  Still having abdominal pain with nausea.  Will obtain CT imaging.  As well as discussed with patient.  Will refer to general surgery for further evaluation of her gallstones and suspected biliary colic.  Care to be transferred at shift change pending CT scan.  Dr. Adela Lank to assume care.        Final Clinical Impression(s) / ED Diagnoses Final diagnoses:  Epigastric pain  Biliary calculus of other site without obstruction    Rx / DC Orders ED Discharge Orders     None         Igor Bishop, Jeannett Senior, MD 02/15/23 [id number]

## 2023-02-15 NOTE — ED Triage Notes (Addendum)
Patient BIB EMS from home c/o abdominal pain this morning. Pt report worsening epigastric pain radiating to her chest.  Patient report emesis x2 this morning. Pt a/ox4.

## 2023-02-15 NOTE — Discharge Instructions (Addendum)
You do have gallstones but no evidence of gallbladder infection at this time.  Take the nausea medication as prescribed and follow-up with your primary doctor as well as a general surgeon for further evaluation of your gallstones.  Start with a clear liquid diet and advance slowly as tolerated.  Return to the ED if worsening pain, fever, not able to eat or drink or other concerns.
